# Patient Record
Sex: Male | Born: 1970 | Race: White | Hispanic: No | Marital: Married | Smoking: Never smoker
Health system: Southern US, Community
[De-identification: ages and names within clinical notes are randomized; demographics above are authoritative.]

## PROBLEM LIST (undated history)

## (undated) DIAGNOSIS — K219 Gastro-esophageal reflux disease without esophagitis: Secondary | ICD-10-CM

## (undated) HISTORY — PX: VASECTOMY: SHX75

## (undated) HISTORY — DX: Gastro-esophageal reflux disease without esophagitis: K21.9

---

## 2008-02-11 ENCOUNTER — Ambulatory Visit: Payer: Self-pay | Admitting: Sports Medicine

## 2008-02-11 DIAGNOSIS — IMO0002 Reserved for concepts with insufficient information to code with codable children: Secondary | ICD-10-CM

## 2013-11-19 ENCOUNTER — Encounter (HOSPITAL_COMMUNITY): Payer: Self-pay | Admitting: Emergency Medicine

## 2013-11-19 ENCOUNTER — Emergency Department (HOSPITAL_COMMUNITY)
Admission: EM | Admit: 2013-11-19 | Discharge: 2013-11-19 | Disposition: A | Discharge status: Home / Self Care | Payer: BC Managed Care – PPO | Source: Home / Self Care | Attending: Family Medicine | Admitting: Family Medicine

## 2013-11-19 DIAGNOSIS — B029 Zoster without complications: Secondary | ICD-10-CM

## 2013-11-19 DIAGNOSIS — B019 Varicella without complication: Secondary | ICD-10-CM

## 2013-11-19 DIAGNOSIS — Z23 Encounter for immunization: Secondary | ICD-10-CM

## 2013-11-19 MED ORDER — VALACYCLOVIR HCL 1 G PO TABS: 1000.0000 mg | ORAL_TABLET | Freq: Three times a day (TID) | ORAL | Status: AC

## 2013-11-19 MED ORDER — TETANUS-DIPHTH-ACELL PERTUSSIS 5-2.5-18.5 LF-MCG/0.5 IM SUSP
INTRAMUSCULAR | Status: AC
  Filled 2013-11-19: qty 0.5

## 2013-11-19 MED ORDER — TETANUS-DIPHTH-ACELL PERTUSSIS 5-2.5-18.5 LF-MCG/0.5 IM SUSP
0.5000 mL | Freq: Once | INTRAMUSCULAR | Status: AC
  Administered 2013-11-19: 0.5 mL via INTRAMUSCULAR

## 2013-11-19 NOTE — Discharge Instructions (Signed)
Shingles Shingles (herpes zoster) is an infection that is caused by the same virus that causes chickenpox (varicella). The infection causes a painful skin rash and fluid-filled blisters, which eventually break open, crust over, and heal. It may occur in any area of the body, but it usually affects only one side of the body or face. The pain of shingles usually lasts about 1 month. However, some people with shingles may develop long-term (chronic) pain in the affected area of the body. Shingles often occurs many years after the person had chickenpox. It is more common:  In people older than 50 years.  In people with weakened immune systems, such as those with HIV, AIDS, or cancer.  In people taking medicines that weaken the immune system, such as transplant medicines.  In people under great stress. CAUSES  Shingles is caused by the varicella zoster virus (VZV), which also causes chickenpox. After a person is infected with the virus, it can remain in the person's body for years in an inactive state (dormant). To cause shingles, the virus reactivates and breaks out as an infection in a nerve root. The virus can be spread from person to person (contagious) through contact with open blisters of the shingles rash. It will only spread to people who have not had chickenpox. When these people are exposed to the virus, they may develop chickenpox. They will not develop shingles. Once the blisters scab over, the person is no longer contagious and cannot spread the virus to others. SIGNS AND SYMPTOMS  Shingles shows up in stages. The initial symptoms may be pain, itching, and tingling in an area of the skin. This pain is usually described as burning, stabbing, or throbbing.In a few days or weeks, a painful red rash will appear in the area where the pain, itching, and tingling were felt. The rash is usually on one side of the body in a band or belt-like pattern. Then, the rash usually turns into fluid-filled  blisters. They will scab over and dry up in approximately 2-3 weeks. Flu-like symptoms may also occur with the initial symptoms, the rash, or the blisters. These may include:  Fever.  Chills.  Headache.  Upset stomach. DIAGNOSIS  Your health care provider will perform a skin exam to diagnose shingles. Skin scrapings or fluid samples may also be taken from the blisters. This sample will be examined under a microscope or sent to a lab for further testing. TREATMENT  There is no specific cure for shingles. Your health care provider will likely prescribe medicines to help you manage the pain, recover faster, and avoid long-term problems. This may include antiviral drugs, anti-inflammatory drugs, and pain medicines. HOME CARE INSTRUCTIONS   Take a cool bath or apply cool compresses to the area of the rash or blisters as directed. This may help with the pain and itching.   Take medicines only as directed by your health care provider.   Rest as directed by your health care provider.  Keep your rash and blisters clean with mild soap and cool water or as directed by your health care provider.  Do not pick your blisters or scratch your rash. Apply an anti-itch cream or numbing creams to the affected area as directed by your health care provider.  Keep your shingles rash covered with a loose bandage (dressing).  Avoid skin contact with:  Babies.   Pregnant women.   Children with eczema.   Elderly people with transplants.   People with chronic illnesses, such as leukemia  or AIDS.   Wear loose-fitting clothing to help ease the pain of material rubbing against the rash.  Keep all follow-up visits as directed by your health care provider.If the area involved is on your face, you may receive a referral for a specialist, such as an eye doctor (ophthalmologist) or an ear, nose, and throat (ENT) doctor. Keeping all follow-up visits will help you avoid eye problems, chronic pain, or  disability.  SEEK IMMEDIATE MEDICAL CARE IF:   You have facial pain, pain around the eye area, or loss of feeling on one side of your face.  You have ear pain or ringing in your ear.  You have loss of taste.  Your pain is not relieved with prescribed medicines.   Your redness or swelling spreads.   You have more pain and swelling.  Your condition is worsening or has changed.   You have a fever. MAKE SURE YOU:  Understand these instructions.  Will watch your condition.  Will get help right away if you are not doing well or get worse. Document Released: 01/20/2005 Document Revised: 06/06/2013 Document Reviewed: 09/04/2011 [facility] Patient Information 2015 [place], [place]. This information is not intended to replace advice given to you by your health care provider. Make sure you discuss any questions you have with your health care provider.  Tdap Vaccine (Tetanus, Diphtheria, Pertussis): What You Need to Know 1. Why get vaccinated? Tetanus, diphtheria and pertussis can be very serious diseases, even for adolescents and adults. Tdap vaccine can protect us from these diseases. TETANUS (Lockjaw) causes painful muscle tightening and stiffness, usually all over the body.  It can lead to tightening of muscles in the head and neck so you can't open your mouth, swallow, or sometimes even breathe. Tetanus kills about 1 out of 5 people who are infected. DIPHTHERIA can cause a thick coating to form in the back of the throat.  It can lead to breathing problems, paralysis, heart failure, and death. PERTUSSIS (Whooping Cough) causes severe coughing spells, which can cause difficulty breathing, vomiting and disturbed sleep.  It can also lead to weight loss, incontinence, and rib fractures. Up to 2 in 100 adolescents and 5 in 100 adults with pertussis are hospitalized or have complications, which could include pneumonia or death. These diseases are caused by bacteria. Diphtheria and  pertussis are spread from person to person through coughing or sneezing. Tetanus enters the body through cuts, scratches, or wounds. Before vaccines, the [place] saw as many as 200,000 cases a year of diphtheria and pertussis, and hundreds of cases of tetanus. Since vaccination began, tetanus and diphtheria have dropped by about 99% and pertussis by about 80%. 2. Tdap vaccine Tdap vaccine can protect adolescents and adults from tetanus, diphtheria, and pertussis. One dose of Tdap is routinely given at age 41 or 6112. People who did not get Tdap at that age should get it as soon as possible. Tdap is especially important for health care professionals and anyone having close contact with a baby younger than 12 months. Pregnant women should get a dose of Tdap during every pregnancy, to protect the newborn from pertussis. Infants are most at risk for severe, life-threatening complications from pertussis. A similar vaccine, called Td, protects from tetanus and diphtheria, but not pertussis. A Td booster should be given every 10 years. Tdap may be given as one of these boosters if you have not already gotten a dose. Tdap may also be given after a severe cut or burn to prevent  tetanus infection. Your doctor can give you more information. Tdap may safely be given at the same time as other vaccines. 3. Some people should not get this vaccine  If you ever had a life-threatening allergic reaction after a dose of any tetanus, diphtheria, or pertussis containing vaccine, OR if you have a severe allergy to any part of this vaccine, you should not get Tdap. Tell your doctor if you have any severe allergies.  If you had a coma, or long or multiple seizures within 7 days after a childhood dose of DTP or DTaP, you should not get Tdap, unless a cause other than the vaccine was found. You can still get Td.  Talk to your doctor if you:  have epilepsy or another nervous system problem,  had severe pain or swelling  after any vaccine containing diphtheria, tetanus or pertussis,  ever had Guillain-Barr Syndrome (GBS),  aren't feeling well on the day the shot is scheduled. 4. Risks of a vaccine reaction With any medicine, including vaccines, there is a chance of side effects. These are usually mild and go away on their own, but serious reactions are also possible. Brief fainting spells can follow a vaccination, leading to injuries from falling. Sitting or lying down for about 15 minutes can help prevent these. Tell your doctor if you feel dizzy or light-headed, or have vision changes or ringing in the ears. Mild problems following Tdap (Did not interfere with activities)  Pain where the shot was given (about 3 in 4 adolescents or 2 in 3 adults)  Redness or swelling where the shot was given (about 1 person in 5)  Mild fever of at least 100.74F (up to about 1 in 25 adolescents or 1 in 100 adults)  Headache (about 3 or 4 people in 10)  Tiredness (about 1 person in 3 or 4)  Nausea, vomiting, diarrhea, stomach ache (up to 1 in 4 adolescents or 1 in 10 adults)  Chills, body aches, sore joints, rash, swollen glands (uncommon) Moderate problems following Tdap (Interfered with activities, but did not require medical attention)  Pain where the shot was given (about 1 in 5 adolescents or 1 in 100 adults)  Redness or swelling where the shot was given (up to about 1 in 16 adolescents or 1 in 25 adults)  Fever over 102F (about 1 in 100 adolescents or 1 in 250 adults)  Headache (about 3 in 20 adolescents or 1 in 10 adults)  Nausea, vomiting, diarrhea, stomach ache (up to 1 or 3 people in 100)  Swelling of the entire arm where the shot was given (up to about 3 in 100). Severe problems following Tdap (Unable to perform usual activities; required medical attention)  Swelling, severe pain, bleeding and redness in the arm where the shot was given (rare). A severe allergic reaction could occur after any  vaccine (estimated less than 1 in a million doses). 5. What if there is a serious reaction? What should I look for?  Look for anything that concerns you, such as signs of a severe allergic reaction, very high fever, or behavior changes. Signs of a severe allergic reaction can include hives, swelling of the face and throat, difficulty breathing, a fast heartbeat, dizziness, and weakness. These would start a few minutes to a few hours after the vaccination. What should I do?  If you think it is a severe allergic reaction or other emergency that can't wait, call 9-1-1 or get the person to the nearest hospital. Otherwise, call  your doctor.  Afterward, the reaction should be reported to the "Vaccine Adverse Event Reporting System" (VAERS). Your doctor might file this report, or you can do it yourself through the VAERS web site at www.vaers.LAgents.no, or by calling 1-(313)715-9456. VAERS is only for reporting reactions. They do not give medical advice.  6. The National Vaccine Injury Compensation Program The [employer] Vaccine Injury Compensation Program (VICP) is a federal program that was created to compensate people who may have been injured by certain vaccines. Persons who believe they may have been injured by a vaccine can learn about the program and about filing a claim by calling 1-9734654757 or visiting the VICP website at SpiritualWord.at. 7. How can I learn more?  Ask your doctor.  Call your local or state health department.  Contact the Centers for Disease Control and Prevention (CDC):  Call 2194554705 or visit CDC's website at PicCapture.uy. CDC Tdap Vaccine VIS (06/12/11) Document Released: 07/22/2011 Document Revised: 06/06/2013 Document Reviewed: 05/04/2013 ExitCare Patient Information 2015 [place], [place]. This information is not intended to replace advice given to you by your health care provider. Make sure you discuss any questions you have with your  health care provider.

## 2013-11-19 NOTE — ED Provider Notes (Signed)
CSN: [id number]     Arrival date & time 11/19/13  0908 History   First MD Initiated Contact with Patient 11/19/13 0935     Chief Complaint  Patient presents with  . Rash   (Consider location/radiation/quality/duration/timing/severity/associated sxs/prior Treatment) HPI    43 year old male presents for evaluation of possible shingles rash. He first noticed an area of itching on his right side of his chest 2 days ago. This progressed and started to feel more itchy and he started to feel a spot on his back as well. He had his wife look at it and she says that he probably had shingles. He is concerned because he has a 6 month baby at home and he wants to be checked. He also is requesting a TDaP vaccine. He has no previous history of shingles. No systemic symptoms.  History reviewed. No pertinent past medical history. History reviewed. No pertinent past surgical history. History reviewed. No pertinent family history. History  Substance Use Topics  . Smoking status: Never Smoker   . Smokeless tobacco: Not on file  . Alcohol Use: No    Review of Systems  Skin: Positive for rash.  All other systems reviewed and are negative.   Allergies  Review of patient's allergies indicates no known allergies.  Home Medications   Prior to Admission medications   Medication Sig Start Date End Date Taking? Authorizing Provider  valACYclovir (VALTREX) 1000 MG tablet Take 1 tablet (1,000 mg total) by mouth 3 (three) times daily. 11/19/13 12/03/13  Adrian BlackwaterZachary H Iyani Dresner, PA-C   BP 131/84  Pulse 76  Temp(Src) 98.9 F (37.2 C) (Oral)  Resp 16  SpO2 100% Physical Exam  Nursing note and vitals reviewed. Constitutional: He is oriented to person, place, and time. He appears well-developed and well-nourished. No distress.  HENT:  Head: Normocephalic.  Pulmonary/Chest: Effort normal. No respiratory distress.  Neurological: He is alert and oriented to person, place, and time. Coordination normal.  Skin: Skin  is warm and dry. Rash noted. Rash is vesicular (erythematous, nontender, vesicular rash in the areas described). He is not diaphoretic.     Psychiatric: He has a normal mood and affect. Judgment normal.    ED Course  Procedures (including critical care time) Labs Review Labs Reviewed - No data to display  Imaging Review No results found.   MDM   1. Varicella zoster   2. Need for Tdap vaccination    C/w zoster, will treat with valtrex.  Minimize contact with infant until this heals.  TDaP given.     Meds ordered this encounter  Medications  . Tdap (BOOSTRIX) injection 0.5 mL    Sig:   . valACYclovir (VALTREX) 1000 MG tablet    Sig: Take 1 tablet (1,000 mg total) by mouth 3 (three) times daily.    Dispense:  21 tablet    Refill:  0    Order Specific Question:  Supervising Provider    Answer:  Bradd CanaryKINDL, JAMES D [[id number]]       Graylon GoodZachary H Jarid Sasso, PA-C 11/19/13 1146

## 2013-11-19 NOTE — ED Notes (Signed)
43 year old white male with c/o itching rash unilateral on right side.  He first noticed it 2 days ago. No burning or pain.

## 2013-11-23 NOTE — ED Provider Notes (Signed)
Medical screening examination/treatment/procedure(s) were performed by resident physician or non-physician practitioner and as supervising physician I was immediately available for consultation/collaboration.   Cheney Ewart DOUGLAS MD.   Fredrika Canby D Ginny Loomer, MD 11/23/13 2004 

## 2014-05-28 ENCOUNTER — Telehealth: Payer: Self-pay | Admitting: Family

## 2014-05-28 DIAGNOSIS — J0120 Acute ethmoidal sinusitis, unspecified: Secondary | ICD-10-CM

## 2014-05-28 MED ORDER — AMOXICILLIN-POT CLAVULANATE 875-125 MG PO TABS: 1.0000 | ORAL_TABLET | Freq: Two times a day (BID) | ORAL | Status: DC

## 2014-05-28 NOTE — Progress Notes (Signed)

## 2015-01-15 ENCOUNTER — Ambulatory Visit (INDEPENDENT_AMBULATORY_CARE_PROVIDER_SITE_OTHER): Payer: BC Managed Care – PPO | Admitting: Sports Medicine

## 2015-01-15 ENCOUNTER — Ambulatory Visit
Admission: RE | Admit: 2015-01-15 | Discharge: 2015-01-15 | Disposition: A | Discharge status: Home / Self Care | Payer: BC Managed Care – PPO | Source: Ambulatory Visit | Attending: Sports Medicine | Admitting: Sports Medicine

## 2015-01-15 ENCOUNTER — Encounter: Payer: Self-pay | Admitting: Sports Medicine

## 2015-01-15 VITALS — BP 124/78 | Ht 76.0 in | Wt 250.0 lb

## 2015-01-15 DIAGNOSIS — M25561 Pain in right knee: Secondary | ICD-10-CM

## 2015-01-15 MED ORDER — NITROGLYCERIN 0.2 MG/HR TD PT24: MEDICATED_PATCH | TRANSDERMAL | Status: DC

## 2015-01-15 NOTE — Progress Notes (Signed)
   Subjective:    Patient ID: Marguerite OleaJason Laird, male    DOB: [birth year], 44 y.o.   MRN: [id number]  HPI Mr. Mayford Knifeurner is a 44yo male presenting today for right knee pain. - Pain located midline right knee just above kneecap - Has noted pain off and on for the last two years. Has worsened over the last five months and now notes pain daily. - Pain has wide variability in length of time experienced each day. May last a few minutes or all day long. - Pain worse with squatting, walking up hills, lunging - Denies history of previous knee trauma or injury or surgery - Notes occasional stiff gait, reports normal gait today - Has not tried any medications for this - No swelling   Review of Systems Per HPI    Objective:   Physical Exam General: 44yo male resting comfortably in no apparent distress Cardiac: Lower extremities warm and well perfused Resp: No increased work of breathing noted Musc: Knees symmetric with no effusion noted bilaterally, ROM normal in knees bilaterally, muscle strength 5/5 in lower extremities bilaterally, no tenderness noted over quad tendon however notes this is where pain occurs when it is present, no pain over patellar tendon, no jointline tenderness noted, medial and lateral collateral ligaments intact bilaterally, anterior and posterior drawer negative bilaterally, Thessaly's negative, hip ROM normal bilaterally  MS[facility] ultrasound of the right knee was performed. Limited images were obtained. No effusion. There is a traction spur off of this. Most aspect of the patella with surrounding hypoechoic change seen in both long and short view. No neovascularity. 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 Suspect chronic condition with acute  flare over the last several months. No effusion noted on US. Xray normal.  Plan: - Nitroglycerin patch, protocol given - Eccentric decline squats to be perform daily. - Follow up in 6 weeks. Will re-ultrasound at that time. If symptoms persist or worsen, consider more advanced imaging.

## 2015-01-15 NOTE — Patient Instructions (Signed)

## 2015-02-26 ENCOUNTER — Ambulatory Visit (INDEPENDENT_AMBULATORY_CARE_PROVIDER_SITE_OTHER): Payer: BC Managed Care – PPO | Admitting: Sports Medicine

## 2015-02-26 ENCOUNTER — Encounter: Payer: Self-pay | Admitting: Sports Medicine

## 2015-02-26 VITALS — BP 120/76 | Ht 76.0 in | Wt 250.0 lb

## 2015-02-26 DIAGNOSIS — M769 Unspecified enthesopathy, lower limb, excluding foot: Secondary | ICD-10-CM | POA: Diagnosis not present

## 2015-02-26 DIAGNOSIS — M25561 Pain in right knee: Secondary | ICD-10-CM

## 2015-02-26 DIAGNOSIS — M76899 Other specified enthesopathies of unspecified lower limb, excluding foot: Secondary | ICD-10-CM

## 2015-02-26 NOTE — Patient Instructions (Signed)
You have Anterior Right Knee Pain. We know that your Quadriceps Tendon has some chronic small tearing and old injury, considered tendinitis. This is what we were treating with Nitro patches and exercises.  However, since your symptoms seem to also involve your knee-cap and the lower part of your knee. We would like to investigate further with an MRI to rule out any other knee problems (bones, ligaments, cartilage, tendons).  Consider the following options and call us back to leave a message / discuss with Dr Margaretha Sheffield later this week.  1. MRI Right Knee - you would need to discuss with family and call your insurance provider to see cost, and find out which LOCATION this needs to be ordered at for lowest cost (example, [facility], [place], or other imaging centers) - If this imaging shows no other new problems, then we would continue nitro patches and advance to physical therapy  2. Continue Nitro patches daily, and home exercises for another 4 to 6 weeks  3. Continue Ntri patches daily and do physical therapy for about 4 to 6 weeks

## 2015-02-26 NOTE — Assessment & Plan Note (Addendum)
Persistent somewhat improved Right anterior knee pain (after NTG, exercises, although not 100% compliant) with variable locations (quad tendon, patella and below), exam is benign and non-focal except known chronic quad tendonitis. Not limiting daily activity.  Plan: 1. Discussed several options, including main recommendation to seek further advanced imaging of Right knee, specifically Right Knee MRI to rule out any other pathology (bony, ligaments, cartilage and more detailed eval of quad tendon). He is interested in this but will discuss with family and ins company first, regarding cost. 2. Await phone call from patient within 1 week regarding decision on MRI, and then will advise further, either anticipate he will continue 1/4 NTG patches per protocol anterior knee quad tendon daily (improve compliance) for another 4-6 weeks, continue home squat exercises (improve compliance) or may consider physical therapy referral

## 2015-02-26 NOTE — Progress Notes (Signed)
Subjective:    Patient ID: Ralph Parker, male    DOB: 1970-08-21, 45 y.o.   MRN: [id number]  Ralph Parker is a 45 y.o. male presenting on 02/26/2015 for RIGHT KNEE PAIN HPI  RIGHT KNEE PAIN / QUAD TENDINITIS: - Last visit 01/15/15 for same complaint, at that time reported initial symptoms started about 2 years ago with chronic recurrent problem without known inciting injury or trauma. Last imaging with X-rays Right knee (unremarkable), treated with 1/4 NTG patches daily and home exercises for quad tendonitis for 6 weeks. - Today he reports that overall he seems to be gradually improving, maybe 25% better. He believes that the Right above knee pain is improved but overall hard to describe, the pain seems to have shifted to knee-cap or below. Pain usually occurs during squats and exercises, variable from sharp to aching, usually brief episodes minutes not lasting hours or days. Worse with prolonged standing or sitting (long car trips), not limiting his daily activity. - He estimates about 60-70% compliant with treatment. Tried using NTG 1/4 patch topical on most days, did miss about 1 week when traveled to [place] and when he was sick. Exercises, with squats with heels raised 45*, does have some discomfort and pain with these but seems to be below patella. Doing 4-5x days a week, trying to do set 10 reps about TID - Not taking any OTC analgesics or NSAIDs. No ice, heat, or other topicals - Denies any knee swelling, redness, fevers/chills, weakness, numbness, tingling   Social History   Social History  . Marital Status: Married    Spouse Name: N/A  . Number of Children: N/A  . Years of Education: N/A   Occupational History  . Not on file.   Social History Main Topics  . Smoking status: Never Smoker   . Smokeless tobacco: Not on file  . Alcohol Use: No  . Drug Use: No  . Sexual Activity: Not on file   Other Topics Concern  . Not on file   Social History Narrative    Review of  Systems Per HPI unless specifically indicated above     Objective:    BP 120/76 mmHg  Ht  (1.93 m)  Wt 250 lb (113.399 kg)  BMI 30.44 kg/m2  Wt Readings from Last 3 Encounters:  02/26/15 250 lb (113.399 kg)  01/15/15 250 lb (113.399 kg)  02/11/08 247 lb (112.038 kg)    Physical Exam  Constitutional: He appears well-developed and well-nourished. No distress.  Eyes: EOM are normal.  Musculoskeletal:  Right Knee Inspection: Normal appearance and symmetrical. No ecchymosis or effusion. NTG patch in place. Palpation: Mild +TTP Left knee quad tendon. No creptius ROM: Full active ROM bilaterally Special Testing: Lachman / Valgus/Varus tests negative with intact ligaments (ACL, MCL, LCL). McMurray negative without meniscus symptoms. Strength: 5/5 intact knee flex/ext, ankle dorsi/plantarflex Neurovascular: distally intact sensation light touch and pulses  Neurological: He is alert.  Skin: Skin is warm and dry. He is not diaphoretic.  Nursing note and vitals reviewed.  No results found for this or any previous visit.    Assessment & Plan:   Problem List Items Addressed This Visit    Quadriceps tendonitis   Right anterior knee pain - Primary    Persistent somewhat improved Right anterior knee pain (after NTG, exercises, although not 100% compliant) with variable locations (quad tendon, patella and below), exam is benign and non-focal except known chronic quad tendonitis. Not limiting daily activity.  Plan: 1.  Discussed several options, including main recommendation to seek further advanced imaging of Right knee, specifically Right Knee MRI to rule out any other pathology (bony, ligaments, cartilage and more detailed eval of quad tendon). He is interested in this but will discuss with family and ins company first, regarding cost. 2. Await phone call from patient within 1 week regarding decision on MRI, and then will advise further, either anticipate he will continue 1/4 NTG patches  per protocol anterior knee quad tendon daily (improve compliance) for another 4-6 weeks, continue home squat exercises (improve compliance) or may consider physical therapy referral         No orders of the defined types were placed in this encounter.    Saralyn Pilar, DO [facility], PGY-3  Patient seen and evaluated with the resident. I agree with the above plan of care. Patient continues to have symptoms despite 6 weeks of therapy and treatment for distal quadriceps tendinopathy. I think an MRI is warranted at this time to rule out significant intra-articular pathology but the patient would like to discuss this with his wife first. He will call me in a few days to let me know how he would like to proceed.

## 2015-03-05 ENCOUNTER — Telehealth: Payer: Self-pay | Admitting: *Deleted

## 2015-03-05 NOTE — Telephone Encounter (Signed)
Pt left a message stating he was not getting the MRI due to financial issues, he will continue doing exercises and using the patch for another 4-6 weeks.

## 2018-07-03 ENCOUNTER — Ambulatory Visit (HOSPITAL_COMMUNITY)
Admission: EM | Admit: 2018-07-03 | Discharge: 2018-07-04 | Disposition: A | Discharge status: Home / Self Care | Payer: BC Managed Care – PPO | Attending: Family Medicine | Admitting: Family Medicine

## 2018-07-03 ENCOUNTER — Ambulatory Visit (INDEPENDENT_AMBULATORY_CARE_PROVIDER_SITE_OTHER): Payer: BC Managed Care – PPO

## 2018-07-03 ENCOUNTER — Encounter (HOSPITAL_COMMUNITY): Payer: Self-pay

## 2018-07-03 DIAGNOSIS — S52501A Unspecified fracture of the lower end of right radius, initial encounter for closed fracture: Secondary | ICD-10-CM

## 2018-07-03 NOTE — ED Triage Notes (Signed)
Pt fell on right wrist while running earlier this morning

## 2018-07-03 NOTE — ED Provider Notes (Signed)
[facility]    CSN: [id number] Arrival date & time: 07/03/18  1633     History   Chief Complaint Chief Complaint  Patient presents with  . Wrist Pain    HPI Ralph Parker is a 48 y.o. male.   48 year old male comes in for right wrist pain after fall earlier today.  States tripped and fell with his right arm outstretched.  Pain was not as significant at the time, did ice compress.  However, started noticing decrease in range of motion, particularly with hyperextension.  Has intermittent numbness and tingling to the fingers, and came in for evaluation.  No significant swelling.  Has not taken anything for the symptoms.     History reviewed. No pertinent past medical history.  Patient Active Problem List   Diagnosis Date Noted  . 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 Right anterior knee pain 02/26/2015  . HIP STRAIN, RIGHT 02/11/2008    History reviewed. No pertinent surgical history.     Home Medications    Prior to Admission medications   Not on File    Family History Family History  Problem Relation Age of Onset  . Cancer Father     Social History Social History   Tobacco Use  . Smoking status: Never Smoker  Substance Use Topics  . Alcohol use: No  . Drug use: No     Allergies   Patient has no known allergies.   Review of Systems Review of Systems  Reason unable to perform ROS: See HPI as above.     Physical Exam Triage Vital Signs ED Triage Vitals  Enc Vitals Group     BP 07/03/18 1713 132/80     Pulse Rate 07/03/18 1713 75     Resp 07/03/18 1713 18     Temp 07/03/18 1713 98.1 F (36.7 C)     Temp src --      SpO2 07/03/18 1713 100 %     Weight --      Height --      Head Circumference --      Peak Flow --      Pain Score 07/03/18 1711 3     Pain Loc --      Pain Edu? --      Excl. in GC? --    No data found.  Updated Vital Signs BP 132/80   Pulse 75   Temp 98.1 F (36.7 C)   Resp 18   SpO2 100%   Physical  Exam Constitutional:      General: He is not in acute distress.    Appearance: He is well-developed. He is not diaphoretic.  HENT:     Head: Normocephalic and atraumatic.  Eyes:     Conjunctiva/sclera: Conjunctivae normal.     Pupils: Pupils are equal, round, and reactive to light.  Musculoskeletal:     Comments: No obvious swelling, erythema, contusion, abrasion seen.  Tenderness to palpation of radial aspect of the wrist.  No tenderness to palpation of the MCPs/fingers.  Decreased extension, otherwise able to flex, supinate, pronate.  Full range of motion of fingers. Strength deferred.  Sensation intact and equal bilaterally.  Radial pulse 2+, cap refill less than 2 seconds.  Neurological:     Mental Status: He is alert and oriented to person, place, and time.     UC Treatments / Results  Labs (all labs ordered are listed, but only abnormal results are displayed) Labs Reviewed - No data to display  EKG None  Radiology Dg Wrist Complete Right  Result Date: 07/03/2018 CLINICAL DATA:  Fall, pain EXAM: RIGHT WRIST - COMPLETE 3+ VIEW COMPARISON:  None. FINDINGS: There is a longitudinal fracture noted through the distal right radius extending into the radiocarpal joint and extending proximally into the proximal radial shaft. No visible ulnar abnormality. No subluxation or dislocation. IMPRESSION: Longitudinal distal right radial fracture as described above. Electronically Signed   By: Charlett NoseKevin  Dover M.D.   On: 07/03/2018 18:13    Procedures Procedures (including critical care time)  Medications Ordered in UC Medications - No data to display  Initial Impression / Assessment and Plan / UC Course  I have reviewed the triage vital signs and the nursing notes.  Pertinent labs & imaging results that were available during my care of the patient were reviewed by me and considered in my medical decision making (see chart for details).    Will obtain xray for further evaluation needed.   Discussed x-ray results with patient.  Discussed sugar tong splint versus pre-fabrication thumb spica with sling.  Patient would like to defer sugar tong splint due to time.  Thumb spica and sling applied. Patient states pain at worse 5/10, will try tylenol/motrin at this time. Ice compress. Will defer Norco at this time, but to call if needed. Patient to follow up with hand ortho on Monday for further evaluation needed.  Final Clinical Impressions(s) / UC Diagnoses   Final diagnoses:  Closed fracture of distal end of right radius, unspecified fracture morphology, initial encounter   ED Prescriptions    None       Belinda FisherYu, Amy V, PA-C 07/03/18 1847

## 2018-07-03 NOTE — Discharge Instructions (Signed)
As discussed, xray shows fracture to your distal radius. Keep splint on at all times. Sling to prevent movement. Ibuprofen/tylenol for pain. Can call back if needing stronger medicine for pain relief. Follow up with orthopedic on Monday for further evaluation and management needed.

## 2019-04-10 ENCOUNTER — Ambulatory Visit: Payer: BC Managed Care – PPO

## 2019-11-20 IMAGING — DX RIGHT WRIST - COMPLETE 3+ VIEW
4 series · 4 of 4 positions shown · non-contrast
Comparison: None.

CLINICAL DATA: Fall, pain

EXAM:
RIGHT WRIST - COMPLETE 3+ VIEW

[wrist pa]
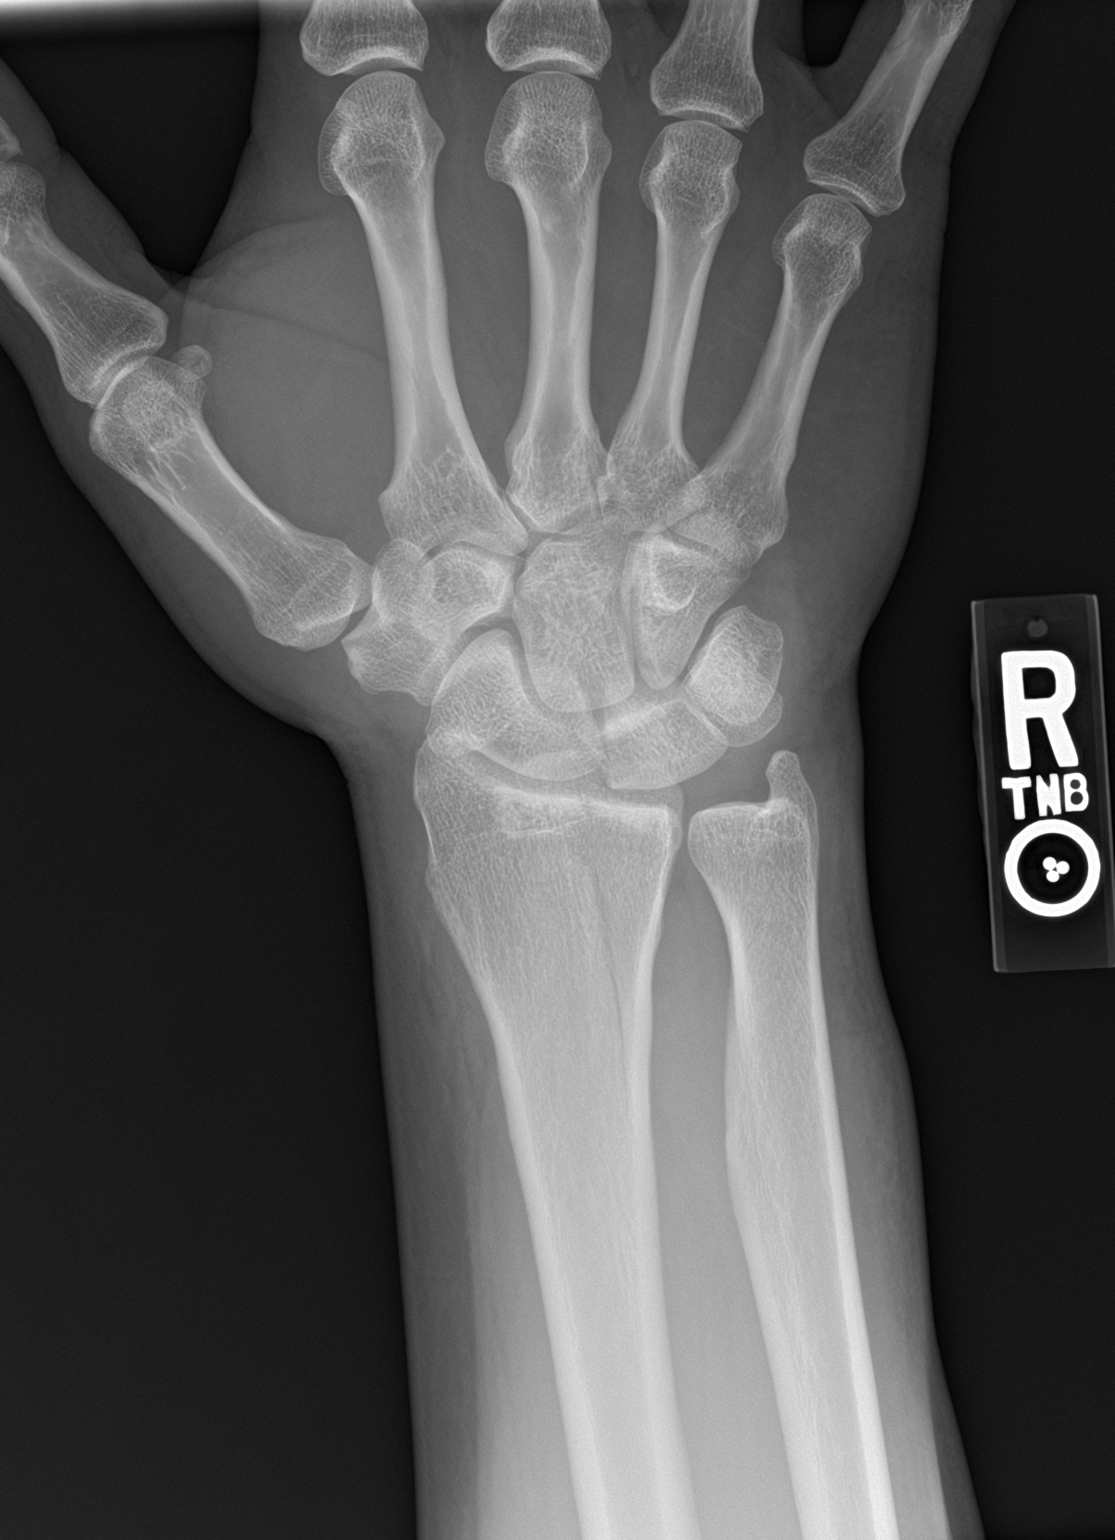

[wrist navicular]
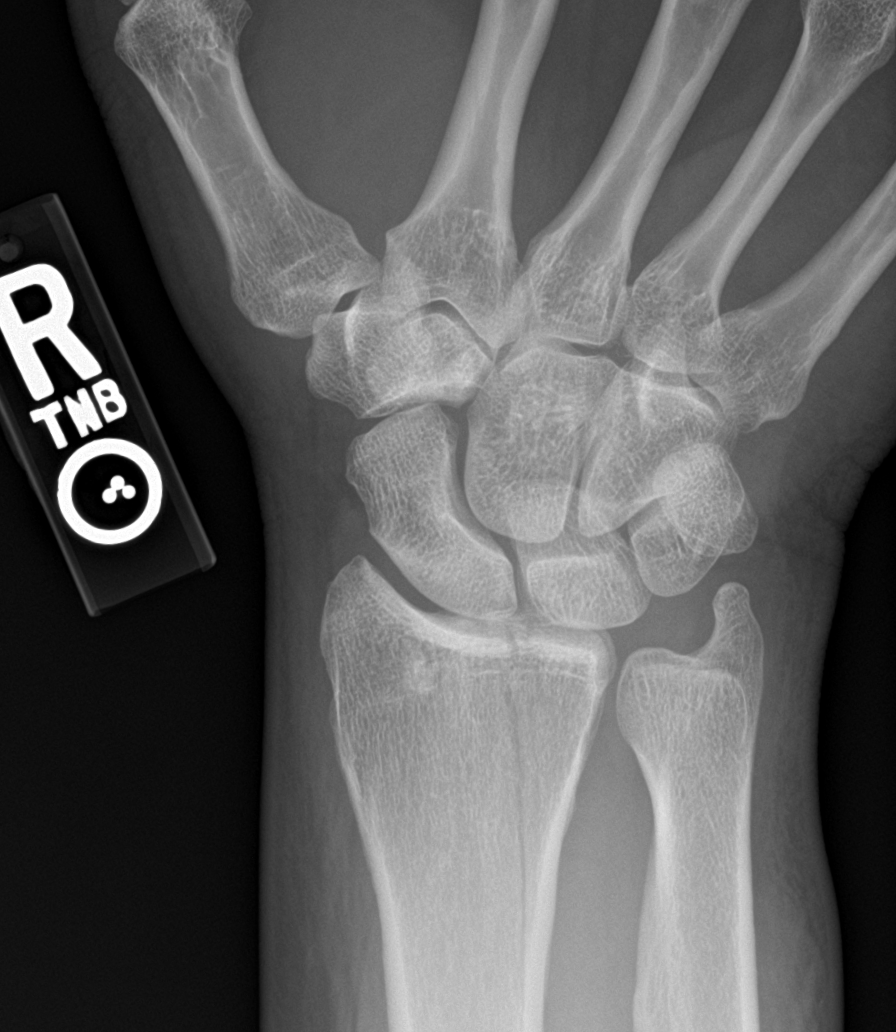

[wrist obl]
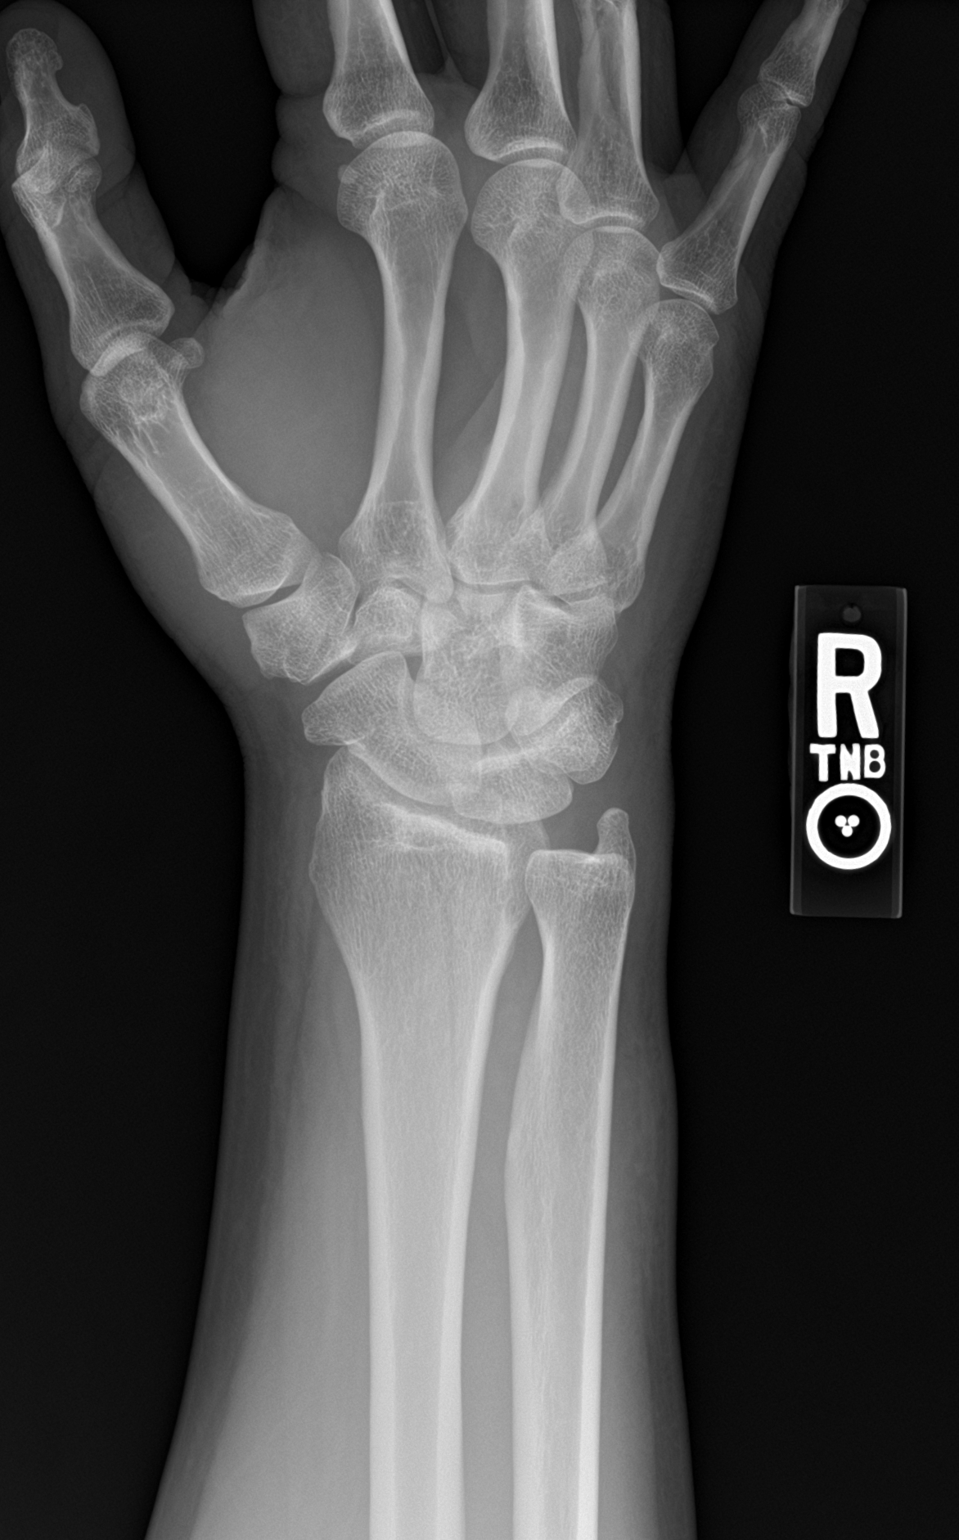

[wrist lat]
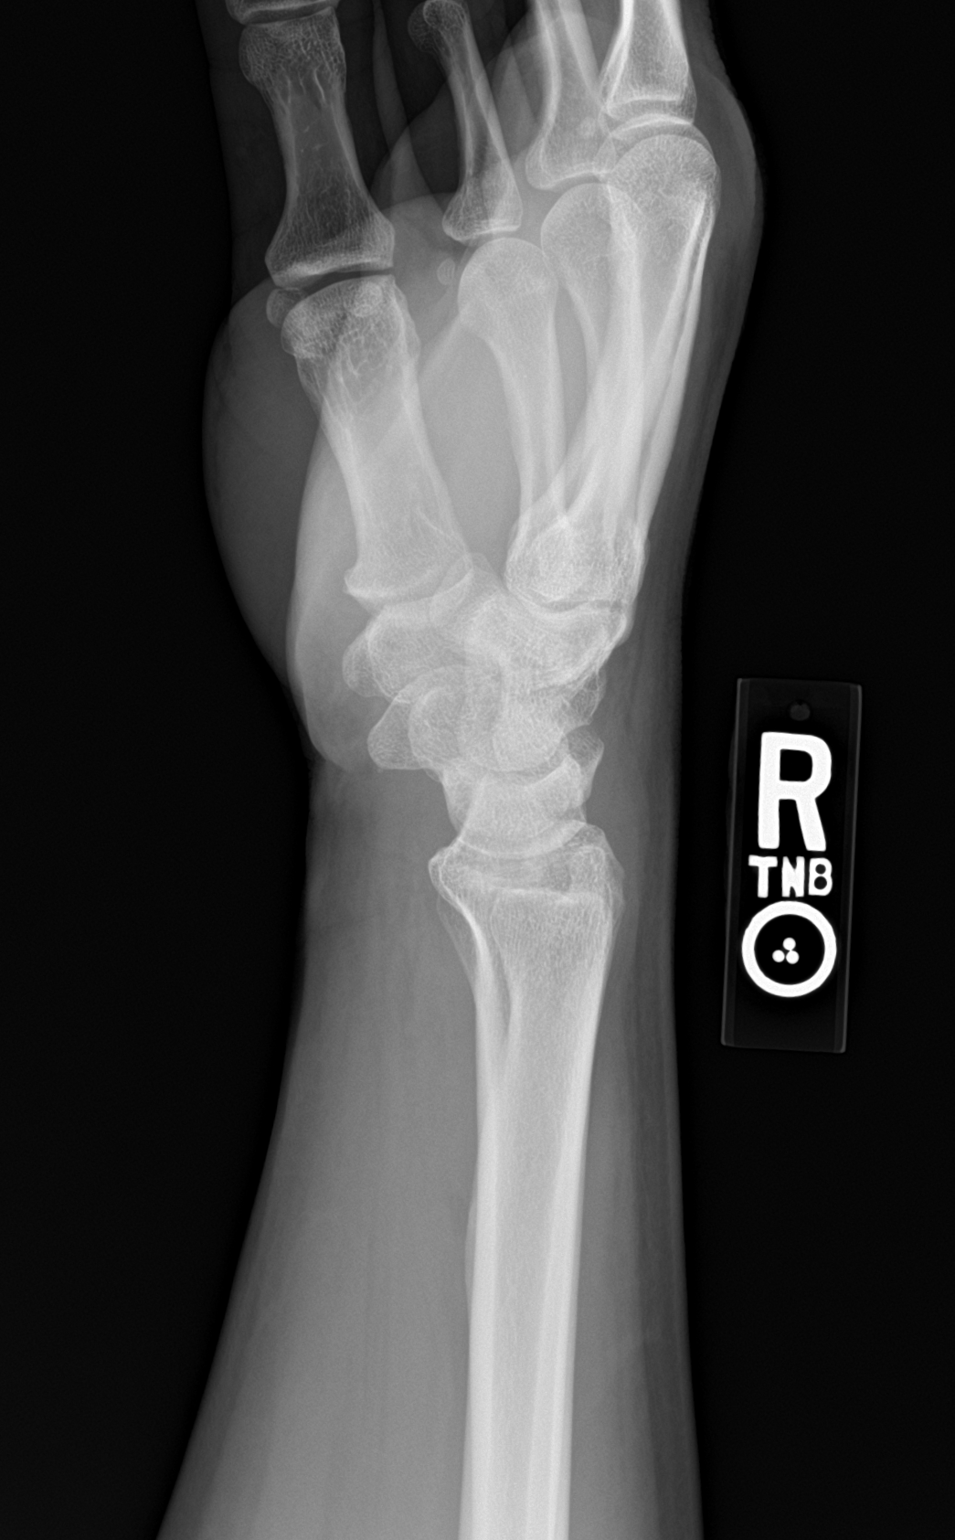

[4 of 4 positions shown; findings below may reference images not displayed]

FINDINGS: There is a longitudinal fracture noted through the distal right
radius extending into the radiocarpal joint and extending proximally
into the proximal radial shaft. No visible ulnar abnormality. No
subluxation or dislocation.
IMPRESSION: Longitudinal distal right radial fracture as described above.

## 2019-12-15 ENCOUNTER — Ambulatory Visit: Payer: BC Managed Care – PPO | Attending: Internal Medicine

## 2019-12-15 DIAGNOSIS — Z23 Encounter for immunization: Secondary | ICD-10-CM

## 2019-12-15 NOTE — Progress Notes (Signed)
° °  Covid-19 Vaccination Clinic  Name:  Ralph Parker    MRN: [id number] DOB: Nov 09, 1970  12/15/2019  [name] was observed post Covid-19 immunization for 15 minutes without incident. He was provided with Vaccine Information Sheet and instruction to access the V-Safe system.   [name] was instructed to call 911 with any severe reactions post vaccine:  Difficulty breathing   Swelling of face and throat   A fast heartbeat   A bad rash all over body   Dizziness and weakness

## 2021-08-13 ENCOUNTER — Encounter: Payer: Self-pay | Admitting: *Deleted

## 2022-02-10 ENCOUNTER — Telehealth: Payer: Self-pay | Admitting: Internal Medicine

## 2022-02-10 NOTE — Telephone Encounter (Signed)
Pt wanted to know about getting an EGD scheduled with his colonoscopy. Advised pt that he would need to have an office visit with provider to discuss that. Pt was transferred to the front to schedule appt.

## 2022-02-10 NOTE — Telephone Encounter (Signed)
Patient left a message concerning the colonoscopy.  He said he received the questionnaire but has questions about the actual procedure and the insurance.  Would like someone to call him back.

## 2022-05-03 NOTE — Progress Notes (Unsigned)
   Referring Provider: Celene Squibb, MD Primary Care Physician:  Celene Squibb, MD Primary Gastroenterologist:  Dr. Abbey Chatters  No chief complaint on file.   HPI:   Ralph Parker is a 52 y.o. male presenting today at the request of [place] Crane, Edwinna Areola, MD for consult colonoscopy, patient also requesting EGD, office visit recommend to discuss further.   Today:   No past medical history on file.  No past surgical history on file.  No current outpatient medications on file.   No current facility-administered medications for this visit.    Allergies as of 05/05/2022   (No Known Allergies)    Family History  Problem Relation Age of Onset   Cancer Father     Social History   Socioeconomic History   Marital status: Married    Spouse name: Not on file   Number of children: Not on file   Years of education: Not on file   Highest education level: Not on file  Occupational History   Not on file  Tobacco Use   Smoking status: Never   Smokeless tobacco: Not on file  Substance and Sexual Activity   Alcohol use: No   Drug use: No   Sexual activity: Not on file  Other Topics Concern   Not on file  Social History Narrative   Not on file   Social Determinants of Health   Financial Resource Strain: Not on file  Food Insecurity: Not on file  Transportation Needs: Not on file  Physical Activity: Not on file  Stress: Not on file  Social Connections: Not on file  Intimate Partner Violence: Not on file    Review of Systems: Gen: Denies any fever, chills, fatigue, weight loss, lack of appetite.  CV: Denies chest pain, heart palpitations, peripheral edema, syncope.  Resp: Denies shortness of breath at rest or with exertion. Denies wheezing or cough.  GI: Denies dysphagia or odynophagia. Denies jaundice, hematemesis, fecal incontinence. GU : Denies urinary burning, urinary frequency, urinary hesitancy MS: Denies joint pain, muscle weakness, cramps, or limitation of movement.  Derm:  Denies rash, itching, dry skin Psych: Denies depression, anxiety, memory loss, and confusion Heme: Denies bruising, bleeding, and enlarged lymph nodes.  Physical Exam: There were no vitals taken for this visit. General:   Alert and oriented. Pleasant and cooperative. Well-nourished and well-developed.  Head:  Normocephalic and atraumatic. Eyes:  Without icterus, sclera clear and conjunctiva pink.  Ears:  Normal auditory acuity. Lungs:  Clear to auscultation bilaterally. No wheezes, rales, or rhonchi. No distress.  Heart:  S1, S2 present without murmurs appreciated.  Abdomen:  +BS, soft, non-tender and non-distended. No HSM noted. No guarding or rebound. No masses appreciated.  Rectal:  Deferred  Msk:  Symmetrical without gross deformities. Normal posture. Extremities:  Without edema. Neurologic:  Alert and  oriented x4;  grossly normal neurologically. Skin:  Intact without significant lesions or rashes. Psych:  Alert and cooperative. Normal mood and affect.    Assessment:     Plan:  ***   Aliene Altes, PA-C [facility] 05/05/2022

## 2022-05-05 ENCOUNTER — Encounter: Payer: Self-pay | Admitting: Gastroenterology

## 2022-05-05 ENCOUNTER — Ambulatory Visit (INDEPENDENT_AMBULATORY_CARE_PROVIDER_SITE_OTHER): Payer: BC Managed Care – PPO | Admitting: Gastroenterology

## 2022-05-05 VITALS — BP 124/83 | HR 71 | Temp 98.0°F | Ht 76.0 in | Wt 262.2 lb

## 2022-05-05 DIAGNOSIS — K219 Gastro-esophageal reflux disease without esophagitis: Secondary | ICD-10-CM

## 2022-05-05 DIAGNOSIS — R1310 Dysphagia, unspecified: Secondary | ICD-10-CM | POA: Diagnosis not present

## 2022-05-05 DIAGNOSIS — Z1211 Encounter for screening for malignant neoplasm of colon: Secondary | ICD-10-CM

## 2022-05-05 NOTE — Patient Instructions (Addendum)
We will arrange for you to have an upper endoscopy with possible stretching of your esophagus and colonoscopy in the near future at [facility] with Dr. Abbey Chatters.  Swallowing precautions:  Eat slowly, take small bites, chew thoroughly, drink plenty of liquids throughout meals.  Avoid trough textures Meats should be chopped finely.  If something gets hung in your esophagus and will not come up or go down, proceed to the emergency room.    Continue taking omeprazole 20 mg daily.  This medication is best taken first thing in the morning 30 minutes before eating breakfast.  We will plan to see back in the office after your procedures.  Do not hesitate to call sooner if you have questions or concerns.  It was a pleasure to see you today! I want to create trusting relationships with patients. If you receive a survey regarding your visit,  I greatly appreciate you taking time to fill this out on paper or through your MyChart. I value your feedback.  Aliene Altes, PA-C [facility]

## 2022-05-07 ENCOUNTER — Ambulatory Visit: Payer: BC Managed Care – PPO | Admitting: Internal Medicine

## 2022-06-16 ENCOUNTER — Telehealth: Payer: Self-pay | Admitting: Gastroenterology

## 2022-06-16 NOTE — Telephone Encounter (Signed)
LMOVM to call back to schedule TCS/EGD +/-ED with Dr. Marletta Lor, ASA 2

## 2022-06-16 NOTE — Telephone Encounter (Signed)
Patient left a message that back in April he had an office visit to get scheduled for colonoscopy and an EGD.  He said he spoke to [place] to tell us to schedule once school gets out and he hasn't heard anything from [place] to get him scheduled.  He asked for a call back .Marland KitchenMarland KitchenMarland Kitchen

## 2022-06-17 NOTE — Telephone Encounter (Signed)
Spoke with pt. He wanted to book in July. Aware will call once we receive that schedule. Placed to call with July.

## 2022-06-24 NOTE — Telephone Encounter (Signed)
Pt called because he thought office had called him to schedule procedure. Advised pt do not see where message was left to call back. Do not have providers schedule for July. He says he can't do 08/18/22-08/22/22

## 2022-07-14 ENCOUNTER — Other Ambulatory Visit: Payer: Self-pay | Admitting: *Deleted

## 2022-07-14 ENCOUNTER — Encounter: Payer: Self-pay | Admitting: *Deleted

## 2022-07-14 MED ORDER — PEG 3350-KCL-NA BICARB-NACL 420 G PO SOLR: 4000.0000 mL | Freq: Once | ORAL | 0 refills | Status: AC

## 2022-07-14 NOTE — Telephone Encounter (Signed)
Pt has been scheduled for 08/12/22. Instructions mailed and prep sent to the pharmacy

## 2022-09-02 ENCOUNTER — Ambulatory Visit (HOSPITAL_COMMUNITY)
Admission: RE | Admit: 2022-09-02 | Discharge: 2022-09-02 | Disposition: A | Discharge status: Home / Self Care | Payer: BC Managed Care – PPO | Attending: Internal Medicine | Admitting: Internal Medicine

## 2022-09-02 ENCOUNTER — Other Ambulatory Visit: Payer: Self-pay

## 2022-09-02 ENCOUNTER — Encounter (HOSPITAL_COMMUNITY): Payer: Self-pay

## 2022-09-02 ENCOUNTER — Ambulatory Visit (HOSPITAL_COMMUNITY): Payer: BC Managed Care – PPO | Admitting: Certified Registered Nurse Anesthetist

## 2022-09-02 ENCOUNTER — Encounter (HOSPITAL_COMMUNITY)
Admission: RE | Disposition: A | Discharge status: Home / Self Care | Payer: Self-pay | Source: Home / Self Care | Attending: Internal Medicine

## 2022-09-02 DIAGNOSIS — K2100 Gastro-esophageal reflux disease with esophagitis, without bleeding: Secondary | ICD-10-CM

## 2022-09-02 DIAGNOSIS — D125 Benign neoplasm of sigmoid colon: Secondary | ICD-10-CM

## 2022-09-02 DIAGNOSIS — R1310 Dysphagia, unspecified: Secondary | ICD-10-CM | POA: Insufficient documentation

## 2022-09-02 DIAGNOSIS — D120 Benign neoplasm of cecum: Secondary | ICD-10-CM

## 2022-09-02 DIAGNOSIS — K317 Polyp of stomach and duodenum: Secondary | ICD-10-CM | POA: Diagnosis not present

## 2022-09-02 DIAGNOSIS — D122 Benign neoplasm of ascending colon: Secondary | ICD-10-CM | POA: Diagnosis not present

## 2022-09-02 DIAGNOSIS — D124 Benign neoplasm of descending colon: Secondary | ICD-10-CM | POA: Diagnosis not present

## 2022-09-02 DIAGNOSIS — K648 Other hemorrhoids: Secondary | ICD-10-CM | POA: Diagnosis not present

## 2022-09-02 DIAGNOSIS — Z1211 Encounter for screening for malignant neoplasm of colon: Secondary | ICD-10-CM | POA: Diagnosis not present

## 2022-09-02 DIAGNOSIS — K222 Esophageal obstruction: Secondary | ICD-10-CM | POA: Diagnosis not present

## 2022-09-02 HISTORY — PX: COLONOSCOPY WITH PROPOFOL: SHX5780

## 2022-09-02 HISTORY — PX: ESOPHAGOGASTRODUODENOSCOPY (EGD) WITH PROPOFOL: SHX5813

## 2022-09-02 HISTORY — PX: POLYPECTOMY: SHX5525

## 2022-09-02 HISTORY — PX: BALLOON DILATION: SHX5330

## 2022-09-02 SURGERY — COLONOSCOPY WITH PROPOFOL
Anesthesia: General

## 2022-09-02 MED ORDER — PANTOPRAZOLE SODIUM 40 MG PO TBEC: 40.0000 mg | DELAYED_RELEASE_TABLET | Freq: Every day | ORAL | 11 refills | Status: DC

## 2022-09-02 MED ORDER — LIDOCAINE HCL (CARDIAC) PF 100 MG/5ML IV SOSY
PREFILLED_SYRINGE | INTRAVENOUS | Status: DC | PRN
  Administered 2022-09-02: 50 mg via INTRAVENOUS

## 2022-09-02 MED ORDER — PROPOFOL 500 MG/50ML IV EMUL
INTRAVENOUS | Status: DC | PRN
  Administered 2022-09-02: 200 ug/kg/min via INTRAVENOUS

## 2022-09-02 MED ORDER — LACTATED RINGERS IV SOLN: INTRAVENOUS | Status: DC

## 2022-09-02 MED ORDER — STERILE WATER FOR IRRIGATION IR SOLN
Status: DC | PRN
  Administered 2022-09-02: 150 mL

## 2022-09-02 MED ORDER — PROPOFOL 10 MG/ML IV BOLUS
INTRAVENOUS | Status: DC | PRN
Start: 2022-09-02 — End: 2022-09-02
  Administered 2022-09-02: 50 mg via INTRAVENOUS
  Administered 2022-09-02: 100 mg via INTRAVENOUS

## 2022-09-02 NOTE — H&P (Signed)
Primary Care Physician:  Benita Stabile, MD Primary Gastroenterologist:  Dr. Marletta Lor  Pre-Procedure History & Physical: HPI:  Ralph Parker is a 52 y.o. male is here for an EGD with possible dilation due to chronic GERD/dysphagia and a colonoscopy to be performed for colon cancer screening purposes.  History reviewed. No pertinent past medical history.  Past Surgical History:  Procedure Laterality Date   VASECTOMY      Prior to Admission medications   Medication Sig Start Date End Date Taking? Authorizing Provider  omeprazole (PRILOSEC OTC) 20 MG tablet Take 20 mg by mouth every evening.   Yes [provider]    Allergies as of 07/14/2022   (No Known Allergies)    Family History  Problem Relation Age of Onset   Cancer Father    Colon cancer Neg Hx    Esophageal cancer Neg Hx     Social History   Socioeconomic History   Marital status: Married    Spouse name: Not on file   Number of children: Not on file   Years of education: Not on file   Highest education level: Not on file  Occupational History   Not on file  Tobacco Use   Smoking status: Never   Smokeless tobacco: Not on file  Vaping Use   Vaping status: Never Used  Substance and Sexual Activity   Alcohol use: Yes    Comment: occasional- every 2-3 weeks.   Drug use: No   Sexual activity: Yes  Other Topics Concern   Not on file  Social History Narrative   Not on file   Social Determinants of Health   Financial Resource Strain: Not on file  Food Insecurity: Not on file  Transportation Needs: Not on file  Physical Activity: Not on file  Stress: Not on file  Social Connections: Not on file  Intimate Partner Violence: Not on file    Review of Systems: See HPI, otherwise negative ROS  Physical Exam: Vital signs in last 24 hours: Temp:  [98.6 F (37 C)] 98.6 F (37 C) (07/30 0651) Pulse Rate:  [69] 69 (07/30 0651) Resp:  [17] 17 (07/30 0651) BP: (135)/(77) 135/77 (07/30 0651) SpO2:  [99 %]  99 % (07/30 0651) Weight:  [113.4 kg] 113.4 kg (07/30 0651)   General:   Alert,  Well-developed, well-nourished, pleasant and cooperative in NAD Head:  Normocephalic and atraumatic. Eyes:  Sclera clear, no icterus.   Conjunctiva pink. Ears:  Normal auditory acuity. Nose:  No deformity, discharge,  or lesions. Msk:  Symmetrical without gross deformities. Normal posture. Extremities:  Without clubbing or edema. Neurologic:  Alert and  oriented x4;  grossly normal neurologically. Skin:  Intact without significant lesions or rashes. Psych:  Alert and cooperative. Normal mood and affect.  Impression/Plan: Ralph Parker is here for an EGD with possible dilation due to chronic GERD/dysphagia and a colonoscopy to be performed for colon cancer screening purposes.  The risks of the procedure including infection, bleed, or perforation as well as benefits, limitations, alternatives and imponderables have been reviewed with the patient. Questions have been answered. All parties agreeable.

## 2022-09-02 NOTE — Op Note (Signed)
[facility] Patient Name: Ralph Parker Procedure Date: 09/02/2022 7:13 AM MRN: [id number] Date of Birth: 09-08-1970 Attending MD: Hennie Duos. Marletta Lor , [place], [id number] CSN: [id number] Age: 52 Admit Type: Outpatient Procedure:                Upper GI endoscopy Indications:              Dysphagia, Heartburn Providers:                Hennie Duos. Marletta Lor, DO, Buel Ream. Thomasena Edis RN, RN,                            Lennice Sites Technician, Technician, Elinor Parkinson Referring MD:              Medicines:                See the Anesthesia note for documentation of the                            administered medications Complications:            No immediate complications. Estimated Blood Loss:     Estimated blood loss was minimal. Procedure:                Pre-Anesthesia Assessment:                           - The anesthesia plan was to use monitored                            anesthesia care (MAC).                           After obtaining informed consent, the endoscope was                            passed under direct vision. Throughout the                            procedure, the patient's blood pressure, pulse, and                            oxygen saturations were monitored continuously. The                            [id number]-H190 ([id number]) scope was introduced through the                            mouth, and advanced to the second part of duodenum.                            The upper GI endoscopy was accomplished without                            difficulty. The patient tolerated the procedure  well. Scope In: 7:51:45 AM Scope Out: 7:59:50 AM Total Procedure Duration: 0 hours 8 minutes 5 seconds  Findings:      A mild Schatzki ring was found in the distal esophagus. A TTS dilator       was passed through the scope. Dilation with an 18-19-20 mm balloon       dilator was performed to 20 mm. The dilation site was examined and       showed mild mucosal  disruption and moderate improvement in luminal       narrowing.      Multiple small sessile polyps (all <1 cm) with no bleeding and no       stigmata of recent bleeding were found in the gastric body. Three of       larger polyps (though all still <1cm) were removed with a cold snare for       sampling purposes. Resection and retrieval were complete.      The duodenal bulb, first portion of the duodenum and second portion of       the duodenum were normal.      LA Grade A (one or more mucosal breaks less than 5 mm, not extending       between tops of 2 mucosal folds) esophagitis with no bleeding was found       at the gastroesophageal junction. Impression:               - Mild Schatzki ring. Dilated.                           - Multiple gastric polyps. Resected and retrieved.                           - Normal duodenal bulb, first portion of the                            duodenum and second portion of the duodenum.                           - LA Grade A reflux esophagitis with no bleeding. Moderate Sedation:      Per Anesthesia Care Recommendation:           - Patient has a contact number available for                            emergencies. The signs and symptoms of potential                            delayed complications were discussed with the                            patient. Return to normal activities tomorrow.                            Written discharge instructions were provided to the                            patient.                           -  Resume previous diet.                           - Continue present medications.                           - Await pathology results.                           - Repeat upper endoscopy PRN for retreatment.                           - Return to GI clinic in 3 months.                           - Use Protonix (pantoprazole) 40 mg PO daily. Procedure Code(s):        --- Professional ---                           [phone],  Esophagogastroduodenoscopy, flexible,                            transoral; with removal of tumor(s), polyp(s), or                            other lesion(s) by snare technique                           [id number], Esophagogastroduodenoscopy, flexible,                            transoral; with transendoscopic balloon dilation of                            esophagus (less than 30 mm diameter) Diagnosis Code(s):        --- Professional ---                           K22.2, Esophageal obstruction                           K31.7, Polyp of stomach and duodenum                           R13.10, Dysphagia, unspecified                           R12, Heartburn CPT copyright 2022 American [employer] Association. All rights reserved. The codes documented in this report are preliminary and upon coder review may  be revised to meet current compliance requirements. Hennie Duos. Marletta Lor, DO Hennie Duos. Marletta Lor, DO 09/02/2022 8:26:04 AM This report has been signed electronically. Number of Addenda: 0

## 2022-09-02 NOTE — Op Note (Signed)
[facility] Patient Name: Ralph Parker Procedure Date: 09/02/2022 7:12 AM MRN: [id number] Date of Birth: [birth year] Attending MD: Hennie Duos. Marletta Lor , [place], [id number] CSN: [id number] Age: 51 Admit Type: Outpatient Procedure:                Colonoscopy Indications:              Screening for colorectal malignant neoplasm Providers:                Hennie Duos. Marletta Lor, DO, Buel Ream. Thomasena Edis RN, RN,                            Lennice Sites Technician, Technician, Elinor Parkinson Referring MD:              Medicines:                See the Anesthesia note for documentation of the                            administered medications Complications:            No immediate complications. Estimated Blood Loss:     Estimated blood loss was minimal. Procedure:                Pre-Anesthesia Assessment:                           - The anesthesia plan was to use monitored                            anesthesia care (MAC).                           After obtaining informed consent, the colonoscope                            was passed under direct vision. Throughout the                            procedure, the patient's blood pressure, pulse, and                            oxygen saturations were monitored continuously. The                            [id number]-HQ190L ([id number]) scope was introduced through                            the anus and advanced to the the cecum, identified                            by appendiceal orifice and ileocecal valve. The                            colonoscopy was performed without difficulty. The  patient tolerated the procedure well. The quality                            of the bowel preparation was evaluated using the                            BBPS [facility] Bowel Preparation Scale) with scores                            of: Right Colon = 3, Transverse Colon = 3 and Left                            Colon = 3 (entire mucosa seen well with no  residual                            staining, small fragments of stool or opaque                            liquid). The total BBPS score equals 9. Scope In: 8:05:43 AM Scope Out: 8:24:05 AM Scope Withdrawal Time: 0 hours 16 minutes 45 seconds  Total Procedure Duration: 0 hours 18 minutes 22 seconds  Findings:      Non-bleeding internal hemorrhoids were found during endoscopy.      Two sessile polyps were found in the ascending colon and cecum. The       polyps were 4 to 8 mm in size. These polyps were removed with a cold       snare. Resection and retrieval were complete.      A 3 mm polyp was found in the descending colon. The polyp was sessile.       The polyp was removed with a cold snare. Resection and retrieval were       complete.      An 8 mm polyp was found in the sigmoid colon. The polyp was       pedunculated. The polyp was removed with a cold snare. Resection and       retrieval were complete.      The exam was otherwise without abnormality. Impression:               - Non-bleeding internal hemorrhoids.                           - Two 4 to 8 mm polyps in the ascending colon and                            in the cecum, removed with a cold snare. Resected                            and retrieved.                           - One 3 mm polyp in the descending colon, removed                            with a cold snare. Resected  and retrieved.                           - One 8 mm polyp in the sigmoid colon, removed with                            a cold snare. Resected and retrieved.                           - The examination was otherwise normal. Moderate Sedation:      Per Anesthesia Care Recommendation:           - Patient has a contact number available for                            emergencies. The signs and symptoms of potential                            delayed complications were discussed with the                            patient. Return to normal activities  tomorrow.                            Written discharge instructions were provided to the                            patient.                           - Continue present medications.                           - Resume previous diet.                           - Await pathology results.                           - Repeat colonoscopy in 5 years for surveillance.                           - Return to GI clinic in 3 months. Procedure Code(s):        --- Professional ---                           [phone], Colonoscopy, flexible; with removal of                            tumor(s), polyp(s), or other lesion(s) by snare                            technique Diagnosis Code(s):        --- Professional ---                           Z12.11, Encounter  for screening for malignant                            neoplasm of colon                           D12.2, Benign neoplasm of ascending colon                           D12.0, Benign neoplasm of cecum                           D12.4, Benign neoplasm of descending colon                           D12.5, Benign neoplasm of sigmoid colon                           K64.8, Other hemorrhoids CPT copyright 2022 American Medical Association. All rights reserved. The codes documented in this report are preliminary and upon coder review may  be revised to meet current compliance requirements. Hennie Duos. Marletta Lor, DO Hennie Duos. Marletta Lor, DO 09/02/2022 8:28:02 AM This report has been signed electronically. Number of Addenda: 0

## 2022-09-02 NOTE — Discharge Instructions (Addendum)
EGD Discharge instructions Please read the instructions outlined below and refer to this sheet in the next few weeks. These discharge instructions provide you with general information on caring for yourself after you leave the hospital. Your doctor may also give you specific instructions. While your treatment has been planned according to the most current medical practices available, unavoidable complications occasionally occur. If you have any problems or questions after discharge, please call your doctor. ACTIVITY You may resume your regular activity but move at a slower pace for the next 24 hours.  Take frequent rest periods for the next 24 hours.  Walking will help expel (get rid of) the air and reduce the bloated feeling in your abdomen.  No driving for 24 hours (because of the anesthesia (medicine) used during the test).  You may shower.  Do not sign any important legal documents or operate any machinery for 24 hours (because of the anesthesia used during the test).  NUTRITION Drink plenty of fluids.  You may resume your normal diet.  Begin with a light meal and progress to your normal diet.  Avoid alcoholic beverages for 24 hours or as instructed by your caregiver.  MEDICATIONS You may resume your normal medications unless your caregiver tells you otherwise.  WHAT YOU CAN EXPECT TODAY You may experience abdominal discomfort such as a feeling of fullness or "gas" pains.  FOLLOW-UP Your doctor will discuss the results of your test with you.  SEEK IMMEDIATE MEDICAL ATTENTION IF ANY OF THE FOLLOWING OCCUR: Excessive nausea (feeling sick to your stomach) and/or vomiting.  Severe abdominal pain and distention (swelling).  Trouble swallowing.  Temperature over 101 F (37.8 C).  Rectal bleeding or vomiting of blood.     Colonoscopy Discharge Instructions  Read the instructions outlined below and refer to this sheet in the next few weeks. These discharge instructions provide you with  general information on caring for yourself after you leave the hospital. Your doctor may also give you specific instructions. While your treatment has been planned according to the most current medical practices available, unavoidable complications occasionally occur.   ACTIVITY You may resume your regular activity, but move at a slower pace for the next 24 hours.  Take frequent rest periods for the next 24 hours.  Walking will help get rid of the air and reduce the bloated feeling in your belly (abdomen).  No driving for 24 hours (because of the medicine (anesthesia) used during the test).   Do not sign any important legal documents or operate any machinery for 24 hours (because of the anesthesia used during the test).  NUTRITION Drink plenty of fluids.  You may resume your normal diet as instructed by your doctor.  Begin with a light meal and progress to your normal diet. Heavy or fried foods are harder to digest and may make you feel sick to your stomach (nauseated).  Avoid alcoholic beverages for 24 hours or as instructed.  MEDICATIONS You may resume your normal medications unless your doctor tells you otherwise.  WHAT YOU CAN EXPECT TODAY Some feelings of bloating in the abdomen.  Passage of more gas than usual.  Spotting of blood in your stool or on the toilet paper.  IF YOU HAD POLYPS REMOVED DURING THE COLONOSCOPY: No aspirin products for 7 days or as instructed.  No alcohol for 7 days or as instructed.  Eat a soft diet for the next 24 hours.  FINDING OUT THE RESULTS OF YOUR TEST Not all test results are  available during your visit. If your test results are not back during the visit, make an appointment with your caregiver to find out the results. Do not assume everything is normal if you have not heard from your caregiver or the medical facility. It is important for you to follow up on all of your test results.  SEEK IMMEDIATE MEDICAL ATTENTION IF: You have more than a spotting of  blood in your stool.  Your belly is swollen (abdominal distention).  You are nauseated or vomiting.  You have a temperature over 101.  You have abdominal pain or discomfort that is severe or gets worse throughout the day.   Your upper endoscopy revealed a tightening of your esophagus called a Schatzki's ring.  Also evidence of reflux esophagitis.  I did stretch this out today.  Hopefully this helps with the feeling of food getting stuck.  Multiple small polyps noted in your stomach, these are likely benign.  I did remove 3 of them for sampling purposes.  Small bowel appeared normal.  I am going to change her omeprazole to pantoprazole 40 mg daily and have sent this to your pharmacy.  Your colonoscopy revealed 4 polyp(s) which I removed successfully. Await pathology results, my office will contact you. I recommend repeating colonoscopy in 5 years for surveillance purposes.   Follow up in GI office in 3 months. Message sent to office. Will contact you about appointment.    I hope you have a great rest of your week!  Hennie Duos. Marletta Lor, D.O. Gastroenterology and Hepatology [facility]

## 2022-09-02 NOTE — Anesthesia Preprocedure Evaluation (Signed)
Anesthesia Evaluation  Patient identified by MRN, date of birth, ID band Patient awake    Reviewed: Allergy & Precautions, H&P , NPO status , Patient's Chart, lab work & pertinent test results, reviewed documented beta blocker date and time   Airway Mallampati: II  TM Distance: >3 FB Neck ROM: full    Dental no notable dental hx.    Pulmonary neg pulmonary ROS   Pulmonary exam normal breath sounds clear to auscultation       Cardiovascular Exercise Tolerance: Good negative cardio ROS  Rhythm:regular Rate:Normal     Neuro/Psych negative neurological ROS  negative psych ROS   GI/Hepatic negative GI ROS, Neg liver ROS,,,  Endo/Other  negative endocrine ROS    Renal/GU negative Renal ROS  negative genitourinary   Musculoskeletal   Abdominal   Peds  Hematology negative hematology ROS (+)   Anesthesia Other Findings   Reproductive/Obstetrics negative OB ROS                             Anesthesia Physical Anesthesia Plan  ASA: 1  Anesthesia Plan: General   Post-op Pain Management:    Induction:   PONV Risk Score and Plan: Propofol infusion  Airway Management Planned:   Additional Equipment:   Intra-op Plan:   Post-operative Plan:   Informed Consent: I have reviewed the patients History and Physical, chart, labs and discussed the procedure including the risks, benefits and alternatives for the proposed anesthesia with the patient or authorized representative who has indicated his/her understanding and acceptance.     Dental Advisory Given  Plan Discussed with: CRNA  Anesthesia Plan Comments:        Anesthesia Quick Evaluation  

## 2022-09-02 NOTE — Transfer of Care (Signed)
Immediate Anesthesia Transfer of Care Note  Patient: Ralph Parker  Procedure(s) Performed: COLONOSCOPY WITH PROPOFOL ESOPHAGOGASTRODUODENOSCOPY (EGD) WITH PROPOFOL BALLOON DILATION POLYPECTOMY  Patient Location: Endoscopy Unit  Anesthesia Type:General  Level of Consciousness: drowsy  Airway & Oxygen Therapy: Patient Spontanous Breathing  Post-op Assessment: Report given to RN and Post -op Vital signs reviewed and stable  Post vital signs: Reviewed and stable  Last Vitals:  Vitals Value Taken Time  BP 100/51 09/02/22 0826  Temp 36.8 C 09/02/22 0826  Pulse 101 09/02/22 0826  Resp 14 09/02/22 0826  SpO2 94 % 09/02/22 0826    Last Pain:  Vitals:   09/02/22 0826  TempSrc: Axillary  PainSc:       Patients Stated Pain Goal: 7 (09/02/22 0651)  Complications: No notable events documented.

## 2022-09-05 NOTE — Anesthesia Postprocedure Evaluation (Signed)
Anesthesia Post Note  Patient: Ralph Parker  Procedure(s) Performed: COLONOSCOPY WITH PROPOFOL ESOPHAGOGASTRODUODENOSCOPY (EGD) WITH PROPOFOL BALLOON DILATION POLYPECTOMY  Patient location during evaluation: Phase II Anesthesia Type: General Level of consciousness: awake Pain management: pain level controlled Vital Signs Assessment: post-procedure vital signs reviewed and stable Respiratory status: spontaneous breathing and respiratory function stable Cardiovascular status: blood pressure returned to baseline and stable Postop Assessment: no headache and no apparent nausea or vomiting Anesthetic complications: no Comments: Late entry   No notable events documented.   Last Vitals:  Vitals:   09/02/22 0831 09/02/22 0836  BP: (!) 96/53 (!) 108/51  Pulse:  83  Resp: 15 16  Temp:    SpO2: 93% 97%    Last Pain:  Vitals:   09/02/22 0836  TempSrc:   PainSc: 0-No pain                 Windell Norfolk

## 2022-09-08 ENCOUNTER — Encounter (HOSPITAL_COMMUNITY): Payer: Self-pay | Admitting: Internal Medicine

## 2022-10-29 ENCOUNTER — Encounter: Payer: Self-pay | Admitting: Gastroenterology

## 2022-12-08 ENCOUNTER — Ambulatory Visit: Payer: BC Managed Care – PPO | Admitting: Gastroenterology

## 2022-12-10 NOTE — Progress Notes (Unsigned)
Referring Provider: Benita Stabile, MD Primary Care Physician:  Benita Stabile, MD Primary GI Physician: Dr. Marletta Lor  Chief Complaint  Patient presents with   Follow-up    Follow up. No problems     HPI:   Ralph Parker is a 52 y.o. male presenting today for follow-up s/p EGD and colonoscopy which was pursued for dysphagia, new onset GERD last year, and colon cancer screening.  I last saw patient at the time of initial consult on 05/05/22.  He reported new onset GERD over the last year with symptoms well-controlled on omeprazole 20 mg daily.  Also with new onset intermittent solid food dysphagia over the last year.  Recommended EGD to further evaluate upper GI symptoms.  He was also due for first-ever screening colonoscopy and had no significant lower GI symptoms.  This was to be scheduled at the same time as EGD.  Colonoscopy 09/02/2022: Nonbleeding internal hemorrhoids, two 4-8 mm polyps resected and retrieved from the ascending colon and cecum, one 3 mm polyp resected and retrieved from the descending colon, one 8 mm polyp resected and retrieved from the sigmoid colon.  Pathology with tubular adenoma.  Recommended surveillance colonoscopy in 5 years.  EGD 09/02/2022:  Grade A reflux esophagitis, mild Schatzki's ring dilated, multiple gastric polyps with 3 resected and retrieved, normal examined duodenum.  Pathology with fundic gland polyp.  Recommended Protonix 40 mg daily.  Today: GERD: Doing well on pantoprazole 40 mg daily.  Occasional breakthrough if eating a large meal or something fatty. This is infrequent. No abdominal pain, nausea, vomiting.  Dysphagia: Resolved with esophageal dilation.   No lower GI concerns.   Past Medical History:  Diagnosis Date   GERD (gastroesophageal reflux disease)     Past Surgical History:  Procedure Laterality Date   BALLOON DILATION N/A 09/02/2022   Procedure: BALLOON DILATION;  Surgeon: Lanelle Bal, DO;  Location: [facility];   Service: Endoscopy;  Laterality: N/A;   COLONOSCOPY WITH PROPOFOL N/A 09/02/2022   Surgeon: Earnest Bailey K, DO;  Nonbleeding internal hemorrhoids, two 4-8 mm polyps resected and retrieved from the ascending colon and cecum, one 3 mm polyp resected and retrieved from the descending colon, one 8 mm polyp resected and retrieved from the sigmoid colon.  Pathology with tubular adenoma.  Recommended surveillance colonoscopy in 5 years.   ESOPHAGOGASTRODUODENOSCOPY (EGD) WITH PROPOFOL N/A 09/02/2022   Surgeon: Lanelle Bal, DO; Grade A reflux esophagitis, mild Schatzki's ring dilated, multiple gastric polyps with 3 resected and retrieved, normal examined duodenum.  Pathology with fundic gland polyp.   POLYPECTOMY  09/02/2022   Procedure: POLYPECTOMY;  Surgeon: Lanelle Bal, DO;  Location: [facility];  Service: Endoscopy;;  gastric / cecal   VASECTOMY      Current Outpatient Medications  Medication Sig Dispense Refill   pantoprazole (PROTONIX) 40 MG tablet Take 1 tablet (40 mg total) by mouth daily. 30 tablet 11   sertraline (ZOLOFT) 25 MG tablet Take 25 mg by mouth daily.     No current facility-administered medications for this visit.    Allergies as of 12/11/2022   (No Known Allergies)    Family History  Problem Relation Age of Onset   Cancer Father    Colon cancer Neg Hx    Esophageal cancer Neg Hx     Social History   Socioeconomic History   Marital status: Married    Spouse name: Not on file   Number of children: Not on  file   Years of education: Not on file   Highest education level: Not on file  Occupational History   Not on file  Tobacco Use   Smoking status: Never   Smokeless tobacco: Not on file  Vaping Use   Vaping status: Never Used  Substance and Sexual Activity   Alcohol use: Yes    Comment: occasional- every 2-3 weeks.   Drug use: No   Sexual activity: Yes  Other Topics Concern   Not on file  Social History Narrative   Not on file   Social  Determinants of Health   Financial Resource Strain: Not on file  Food Insecurity: Not on file  Transportation Needs: Not on file  Physical Activity: Not on file  Stress: Not on file  Social Connections: Not on file    Review of Systems: Gen: Denies fever, chills, cold or flulike symptoms, presyncope, syncope. GI: See HPI Heme: See HPI  Physical Exam: BP 111/73 (BP Location: Left Arm, Patient Position: Sitting, Cuff Size: Large)   Pulse 69   Temp 97.7 F (36.5 C) (Temporal)   Ht 6\' 4"  (1.93 m)   Wt 255 lb 3.2 oz (115.8 kg)   BMI 31.06 kg/m  General:   Alert and oriented. No distress noted. Pleasant and cooperative.  Head:  Normocephalic and atraumatic. Eyes:  Conjuctiva clear without scleral icterus. Abdomen:  +BS, soft, non-tender and non-distended. No rebound or guarding. No HSM or masses noted. Msk:  Symmetrical without gross deformities. Normal posture. Neurologic:  Alert and  oriented x4 Psych:  Normal mood and affect.    Assessment:  52 year old male presenting today for follow-up of GERD and dysphagia following EGD and colonoscopy.  GERD: Fairly well-controlled on pantoprazole 40 mg daily.  Occasional breakthrough if consuming known dietary trigger, but nothing routine.  No alarm symptoms.  Advised that he could add famotidine 20 mg as needed.  Dysphagia: Secondary to Schatzki's ring.  Resolved following recent esophageal dilation 09/02/2022.  History of colon polyps: Recent colonoscopy 09/02/2022 with 4 tubular adenomas removed.  Recommended 5-year surveillance.   Plan:  Continue pantoprazole 40 mg daily. May take famotidine 20 mg as needed prior to consuming known dietary trigger or response to occasional heartburn. Next colonoscopy in 2029. Follow-up in 1 year or sooner if needed.   Ermalinda Memos, PA-C [facility] 12/11/2022

## 2022-12-11 ENCOUNTER — Encounter: Payer: Self-pay | Admitting: Gastroenterology

## 2022-12-11 ENCOUNTER — Ambulatory Visit: Payer: BC Managed Care – PPO | Admitting: Gastroenterology

## 2022-12-11 VITALS — BP 111/73 | HR 69 | Temp 97.7°F | Ht 76.0 in | Wt 255.2 lb

## 2022-12-11 DIAGNOSIS — Z860101 Personal history of adenomatous and serrated colon polyps: Secondary | ICD-10-CM | POA: Diagnosis not present

## 2022-12-11 DIAGNOSIS — R1310 Dysphagia, unspecified: Secondary | ICD-10-CM | POA: Insufficient documentation

## 2022-12-11 DIAGNOSIS — K2100 Gastro-esophageal reflux disease with esophagitis, without bleeding: Secondary | ICD-10-CM | POA: Diagnosis not present

## 2022-12-11 NOTE — Patient Instructions (Signed)
Continue pantoprazole 40 mg daily 30 minutes before breakfast.  If you are going to consume a known dietary trigger/splurge, you can take Pepcid (famotidine) 20 mg prior in efforts to prevent heartburn.  I will plan to see you back in 1 year for routine follow-up or sooner if needed.  Do not hesitate to call if you have questions or concerns.  It was good to see you again today!  I hope you have a Happy Thanksgiving and a Alt[place]e Cabal Christmas!  Ermalinda Memos, PA-C [facility]

## 2023-02-25 ENCOUNTER — Telehealth: Payer: Self-pay

## 2023-02-25 ENCOUNTER — Other Ambulatory Visit: Payer: Self-pay | Admitting: Gastroenterology

## 2023-02-25 DIAGNOSIS — K2100 Gastro-esophageal reflux disease with esophagitis, without bleeding: Secondary | ICD-10-CM

## 2023-02-25 MED ORDER — PANTOPRAZOLE SODIUM 40 MG PO TBEC
40.0000 mg | DELAYED_RELEASE_TABLET | Freq: Every day | ORAL | 3 refills | Status: AC
Start: 2023-02-25 — End: 2024-02-25

## 2023-02-25 NOTE — Telephone Encounter (Signed)
90-day prescription for pantoprazole daily was sent to patient's pharmacy.

## 2023-02-25 NOTE — Telephone Encounter (Signed)
Documentation for New Rx from the pt's pharmacy wanting a 90 day supply instead of a 30 day supply.

## 2023-02-25 NOTE — Telephone Encounter (Signed)
noted 

## 2023-07-23 ENCOUNTER — Other Ambulatory Visit (HOSPITAL_COMMUNITY): Payer: Self-pay | Admitting: Internal Medicine

## 2023-07-23 DIAGNOSIS — E782 Mixed hyperlipidemia: Secondary | ICD-10-CM

## 2023-09-01 ENCOUNTER — Ambulatory Visit (HOSPITAL_COMMUNITY)
Admission: RE | Admit: 2023-09-01 | Discharge: 2023-09-01 | Disposition: A | Discharge status: Home / Self Care | Payer: Self-pay | Source: Ambulatory Visit | Attending: Internal Medicine | Admitting: Internal Medicine

## 2023-09-01 ENCOUNTER — Encounter (HOSPITAL_COMMUNITY): Payer: Self-pay

## 2023-09-01 DIAGNOSIS — E782 Mixed hyperlipidemia: Secondary | ICD-10-CM | POA: Insufficient documentation

## 2023-11-24 ENCOUNTER — Encounter: Payer: Self-pay | Admitting: Gastroenterology

## 2024-03-15 ENCOUNTER — Ambulatory Visit: Payer: Self-pay | Admitting: Gastroenterology
